# Patient Record
Sex: Male | Born: 1985 | Race: White | Hispanic: No | Marital: Married | Smoking: Never smoker
Health system: Southern US, Community
[De-identification: ages and names within clinical notes are randomized; demographics above are authoritative.]

---

## 2015-08-02 ENCOUNTER — Ambulatory Visit (INDEPENDENT_AMBULATORY_CARE_PROVIDER_SITE_OTHER): Admitting: Internal Medicine

## 2015-08-02 ENCOUNTER — Ambulatory Visit: Payer: Self-pay

## 2015-08-02 VITALS — BP 126/84 | HR 106 | Temp 99.9°F | Resp 18 | Ht 67.5 in | Wt 234.0 lb

## 2015-08-02 DIAGNOSIS — M545 Low back pain, unspecified: Secondary | ICD-10-CM

## 2015-08-02 DIAGNOSIS — S39012A Strain of muscle, fascia and tendon of lower back, initial encounter: Secondary | ICD-10-CM

## 2015-08-02 DIAGNOSIS — S46819A Strain of other muscles, fascia and tendons at shoulder and upper arm level, unspecified arm, initial encounter: Secondary | ICD-10-CM

## 2015-08-02 MED ORDER — MELOXICAM 15 MG PO TABS
15.0000 mg | ORAL_TABLET | Freq: Every day | ORAL | Status: AC
Start: 2015-08-02 — End: ?

## 2015-08-02 MED ORDER — CYCLOBENZAPRINE HCL 5 MG PO TABS: 5.0000 mg | ORAL_TABLET | Freq: Three times a day (TID) | ORAL | Status: AC | PRN
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
## 2015-08-02 NOTE — Patient Instructions (Signed)
Because you received an x-ray today, you will receive an invoice from [facility]. Please contact [facility] at [phone] with questions or concerns regarding your invoice. Our billing staff will not be able to assist you with those questions. °
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
